# Patient Record
Sex: Male | Born: 1980 | Race: Black or African American | Hispanic: No | Marital: Single | Smoking: Current every day smoker
Health system: Southern US, Community
[De-identification: ages and names within clinical notes are randomized; demographics above are authoritative.]

---

## 2021-03-08 ENCOUNTER — Emergency Department (HOSPITAL_COMMUNITY)
Admission: EM | Admit: 2021-03-08 | Discharge: 2021-03-08 | Disposition: A | Discharge status: Home / Self Care | Payer: 59 | Attending: Emergency Medicine | Admitting: Emergency Medicine

## 2021-03-08 ENCOUNTER — Encounter (HOSPITAL_COMMUNITY): Payer: Self-pay | Admitting: Emergency Medicine

## 2021-03-08 DIAGNOSIS — S3992XA Unspecified injury of lower back, initial encounter: Secondary | ICD-10-CM | POA: Diagnosis present

## 2021-03-08 DIAGNOSIS — F17210 Nicotine dependence, cigarettes, uncomplicated: Secondary | ICD-10-CM | POA: Diagnosis not present

## 2021-03-08 DIAGNOSIS — S39012A Strain of muscle, fascia and tendon of lower back, initial encounter: Secondary | ICD-10-CM | POA: Insufficient documentation

## 2021-03-08 DIAGNOSIS — Y990 Civilian activity done for income or pay: Secondary | ICD-10-CM | POA: Insufficient documentation

## 2021-03-08 DIAGNOSIS — X500XXA Overexertion from strenuous movement or load, initial encounter: Secondary | ICD-10-CM | POA: Diagnosis not present

## 2021-03-08 MED ORDER — ACETAMINOPHEN 325 MG PO TABS
650.0000 mg | ORAL_TABLET | Freq: Once | ORAL | Status: AC
  Administered 2021-03-08: 650 mg via ORAL
  Filled 2021-03-08: qty 2

## 2021-03-08 MED ORDER — METHOCARBAMOL 500 MG PO TABS
500.0000 mg | ORAL_TABLET | Freq: Once | ORAL | Status: AC
  Administered 2021-03-08: 500 mg via ORAL
  Filled 2021-03-08: qty 1

## 2021-03-08 MED ORDER — METHOCARBAMOL 500 MG PO TABS: 500.0000 mg | ORAL_TABLET | Freq: Two times a day (BID) | ORAL | 0 refills | Status: AC

## 2021-03-08 MED ORDER — KETOROLAC TROMETHAMINE 30 MG/ML IJ SOLN
30.0000 mg | Freq: Once | INTRAMUSCULAR | Status: AC
  Administered 2021-03-08: 30 mg via INTRAMUSCULAR
  Filled 2021-03-08: qty 1

## 2021-03-08 MED ORDER — NAPROXEN 500 MG PO TABS: 500.0000 mg | ORAL_TABLET | Freq: Two times a day (BID) | ORAL | 0 refills | Status: AC

## 2021-03-08 NOTE — ED Triage Notes (Signed)
C/o left lower back pain that radiates down L leg since heavy lifting at work on Friday. 

## 2021-03-08 NOTE — ED Provider Notes (Signed)
[facility] EMERGENCY DEPARTMENT Provider Note   CSN: [id number] Arrival date & time: 03/08/21  1133     History No chief complaint on file.   Matthew Jimenez is a 40 y.o. male presenting to the ED with a chief complaint of back pain.  Was at work doing heavy lifting on 03/04/2021.  He has been having left lower back pain with sharp shooting pain down his left leg.  He is concerned that it could be due to sciatica.  Similar symptoms happened several months ago and improved with medication.  He denies any injuries or falls.  He has tried muscle rubs, icy hot and a back brace with only minimal improvement in his symptoms.  Denies any loss of bowel or bladder function, numbness, weakness, dysuria, fever, history of IV drug use, history of back surgery, shortness of breath, abdominal pain, vomiting.  He remains ambulatory.  HPI     History reviewed. No pertinent past medical history.  There are no problems to display for this patient.   History reviewed. No pertinent surgical history.     No family history on file.  Social History   Tobacco Use   Smoking status: Every Day    Pack years: 0.00    Types: Cigarettes   Smokeless tobacco: Never  Substance Use Topics   Alcohol use: Not Currently   Drug use: Not Currently    Home Medications Prior to Admission medications   Medication Sig Start Date End Date Taking? Authorizing Provider  methocarbamol (ROBAXIN) 500 MG tablet Take 1 tablet (500 mg total) by mouth 2 (two) times daily. 03/08/21  Yes Jeromy Borcherding, PA-C  naproxen (NAPROSYN) 500 MG tablet Take 1 tablet (500 mg total) by mouth 2 (two) times daily. 03/08/21  Yes Jerrid Forgette, PA-C    Allergies    Patient has no allergy information on record.  Review of Systems   Review of Systems  Constitutional:  Negative for chills and fever.  Respiratory:  Negative for shortness of breath.   Gastrointestinal:  Negative for vomiting.  Genitourinary:  Negative for  dysuria.  Musculoskeletal:  Positive for myalgias.  Neurological:  Negative for weakness and numbness.   Physical Exam Updated Vital Signs BP 129/77   Pulse 66   Temp 98.1 F (36.7 C)   Resp 20   SpO2 96%   Physical Exam Vitals and nursing note reviewed.  Constitutional:      General: He is not in acute distress.    Appearance: He is well-developed. He is not diaphoretic.  HENT:     Head: Normocephalic and atraumatic.  Eyes:     General: No scleral icterus.    Conjunctiva/sclera: Conjunctivae normal.  Cardiovascular:     Rate and Rhythm: Normal rate and regular rhythm.  Pulmonary:     Effort: Pulmonary effort is normal. No respiratory distress.     Breath sounds: Normal breath sounds.  Musculoskeletal:        General: Tenderness present.     Cervical back: Normal range of motion.     Lumbar back: Tenderness present.       Back:     Comments: No midline spinal tenderness present in lumbar, thoracic or cervical spine. No step-off palpated. No visible bruising, edema or temperature change noted. No objective signs of numbness present. No saddle anesthesia. 2+ DP pulses bilaterally. Sensation intact to light touch. Strength 5/5 in bilateral lower extremities.  Skin:    Findings: No rash.  Neurological:  Mental Status: He is alert.    ED Results / Procedures / Treatments   Labs (all labs ordered are listed, but only abnormal results are displayed) Labs Reviewed - No data to display  EKG None  Radiology No results found.  Procedures Procedures   Medications Ordered in ED Medications  ketorolac (TORADOL) 30 MG/ML injection 30 mg (30 mg Intramuscular Given 03/08/21 1217)  methocarbamol (ROBAXIN) tablet 500 mg (500 mg Oral Given 03/08/21 1217)  acetaminophen (TYLENOL) tablet 650 mg (650 mg Oral Given 03/08/21 1217)    ED Course  I have reviewed the triage vital signs and the nursing notes.  Pertinent labs & imaging results that were available during my care of  the patient were reviewed by me and considered in my medical decision making (see chart for details).    MDM Rules/Calculators/A&P                          Patient denies any concerning symptoms suggestive of cauda equina requiring urgent imaging at this time such as loss of sensation in the lower extremities, lower extremity weakness, loss of bowel or bladder control, saddle anesthesia, urinary retention, fever/chills, IVDU. Exam demonstrated no  weakness on exam today. No preceding injury or trauma to suggest acute fracture. Doubt pelvic or urinary pathology for patient's acute back pain, as patient denies urinary symptoms, has no CVA tenderness, history/pain not consistent with nephrolithiasis. Doubt AAA as cause of patient's back pain as patient lacks major risk factors, had no abdominal TTP, and has symmetric and intact distal pulses.  I suspect that his symptoms are musculoskeletal in nature and could be related to sciatica.  Symptoms improved with medications given here including anti-inflammatories and muscle relaxer.  We will continue with these medications at home.  Patient given strict return precautions for any symptoms indicating worsening neurologic function in the lower extremities.   Patient is hemodynamically stable, in NAD, and able to ambulate in the ED. Evaluation does not show pathology that would require ongoing emergent intervention or inpatient treatment. I explained the diagnosis to the patient. Pain has been managed and has no complaints prior to discharge. Patient is comfortable with above plan and is stable for discharge at this time. All questions were answered prior to disposition. Strict return precautions for returning to the ED were discussed. Encouraged follow up with PCP.   An After Visit Summary was printed and given to the patient.   Portions of this note were generated with Scientist, clinical (histocompatibility and immunogenetics). Dictation errors may occur despite best attempts at  proofreading.  Final Clinical Impression(s) / ED Diagnoses Final diagnoses:  Strain of lumbar region, initial encounter    Rx / DC Orders ED Discharge Orders          Ordered    naproxen (NAPROSYN) 500 MG tablet  2 times daily        03/08/21 1302    methocarbamol (ROBAXIN) 500 MG tablet  2 times daily        03/08/21 1302             Dietrich Pates, PA-C 03/08/21 1306    Gwyneth Sprout, MD 03/10/21 1225

## 2021-03-08 NOTE — Discharge Instructions (Signed)
Take the medications as prescribed as needed to help with your symptoms. Heat, stretch and massaging the area will be helpful to. Return to the ER if you start to experience worsening pain, injuries or falls, shortness of breath, chest pain, losing control of her bowels or bladder.

## 2022-01-11 ENCOUNTER — Ambulatory Visit (HOSPITAL_COMMUNITY)
Admission: EM | Admit: 2022-01-11 | Discharge: 2022-01-11 | Disposition: A | Discharge status: Home / Self Care | Payer: BC Managed Care – PPO | Attending: Emergency Medicine | Admitting: Emergency Medicine

## 2022-01-11 ENCOUNTER — Ambulatory Visit (INDEPENDENT_AMBULATORY_CARE_PROVIDER_SITE_OTHER): Payer: BC Managed Care – PPO

## 2022-01-11 ENCOUNTER — Encounter (HOSPITAL_COMMUNITY): Payer: Self-pay

## 2022-01-11 DIAGNOSIS — M79661 Pain in right lower leg: Secondary | ICD-10-CM | POA: Diagnosis not present

## 2022-01-11 DIAGNOSIS — M79604 Pain in right leg: Secondary | ICD-10-CM

## 2022-01-11 DIAGNOSIS — M7989 Other specified soft tissue disorders: Secondary | ICD-10-CM | POA: Diagnosis not present

## 2022-01-11 NOTE — ED Triage Notes (Signed)
Pt's states he was moving a refrigerator and it rotated and hit him him on his right leg. Pt states swelling and difficulty moving his right leg.  ?

## 2022-01-11 NOTE — ED Provider Notes (Signed)
?[facility] ? ? ? ?CSN: [id number] ?Arrival date & time: 01/11/22  1436 ? ? ?  ? ?History   ?Chief Complaint ?Chief Complaint  ?Patient presents with  ? Leg Injury  ? ? ?HPI ?Matthew Jimenez is a 41 y.o. male.  ? ?Patient presents with right lower extremity pain and swelling for 1 day.  Endorses that he was carrying a [employer] refrigerator when it hit his leg.  Painful with movement and bearing weight.  Bruising present.  Has attempted to ice and use NSAIDs which have been ineffective.  Denies numbness or tingling. ? ?History reviewed. No pertinent past medical history. ? ?There are no problems to display for this patient. ? ? ?History reviewed. No pertinent surgical history. ? ? ? ? ?Home Medications   ? ?Prior to Admission medications   ?Medication Sig Start Date End Date Taking? Authorizing Provider  ?methocarbamol (ROBAXIN) 500 MG tablet Take 1 tablet (500 mg total) by mouth 2 (two) times daily. 03/08/21   Khatri, Hina, PA-C  ?naproxen (NAPROSYN) 500 MG tablet Take 1 tablet (500 mg total) by mouth 2 (two) times daily. 03/08/21   Dietrich Pates, PA-C  ? ? ?Family History ?History reviewed. No pertinent family history. ? ?Social History ?Social History  ? ?Tobacco Use  ? Smoking status: Every Day  ?  Types: Cigarettes  ? Smokeless tobacco: Never  ?Substance Use Topics  ? Alcohol use: Not Currently  ? Drug use: Not Currently  ? ? ? ?Allergies   ?Patient has no allergy information on record. ? ? ?Review of Systems ?Review of Systems ?Defer to HPI  ? ? ?Physical Exam ?Triage Vital Signs ?ED Triage Vitals  ?Enc Vitals Group  ?   BP 01/11/22 1521 (!) 165/100  ?   Pulse Rate 01/11/22 1521 89  ?   Resp 01/11/22 1521 18  ?   Temp 01/11/22 1521 98.1 ?F (36.7 ?C)  ?   Temp Source 01/11/22 1521 Oral  ?   SpO2 01/11/22 1521 97 %  ?   Weight --   ?   Height --   ?   Head Circumference --   ?   Peak Flow --   ?   Pain Score 01/11/22 1519 8  ?   Pain Loc --   ?   Pain Edu? --   ?   Excl. in GC? --   ? ?No data  found. ? ?Updated Vital Signs ?BP (!) 165/100 (BP Location: Right Arm)   Pulse 89   Temp 98.1 ?F (36.7 ?C) (Oral)   Resp 18   SpO2 97%  ? ?Visual Acuity ?Right Eye Distance:   ?Left Eye Distance:   ?Bilateral Distance:   ? ?Right Eye Near:   ?Left Eye Near:    ?Bilateral Near:    ? ?Physical Exam ?Constitutional:   ?   Appearance: Normal appearance.  ?HENT:  ?   Head: Normocephalic.  ?Eyes:  ?   Extraocular Movements: Extraocular movements intact.  ?Pulmonary:  ?   Effort: Pulmonary effort is normal.  ?Musculoskeletal:  ?   Comments: Ecchymosis and moderate swelling present to the lateral aspect of the right knee and upper and lower extremity, tenderness along the medial aspect of the lower extremity, 2+ popliteal pulse, able to bear weight, range of motion intact  ?Neurological:  ?   Mental Status: He is alert and oriented to person, place, and time. Mental status is at baseline.  ?Psychiatric:     ?  Mood and Affect: Mood normal.     ?   Behavior: Behavior normal.  ? ? ? ?UC Treatments / Results  ?Labs ?(all labs ordered are listed, but only abnormal results are displayed) ?Labs Reviewed - No data to display ? ?EKG ? ? ?Radiology ?No results found. ? ?Procedures ?Procedures (including critical care time) ? ?Medications Ordered in UC ?Medications - No data to display ? ?Initial Impression / Assessment and Plan / UC Course  ?I have reviewed the triage vital signs and the nursing notes. ? ?Pertinent labs & imaging results that were available during my care of the patient were reviewed by me and considered in my medical decision making (see chart for details). ? ?Pain and swelling of the right lower leg ? ?Tib-fib x-ray negative, discussed findings with patient, etiology of symptoms is most likely related to injury to the soft tissue as bruising is present, recommended continued treatment with ice, NSAIDs and compression, recommended elevation in addition, work note given, may follow-up with urgent care as needed  if symptoms persist ?Final Clinical Impressions(s) / UC Diagnoses  ? ?Final diagnoses:  ?None  ? ?Discharge Instructions   ?None ?  ? ?ED Prescriptions   ?None ?  ? ?PDMP not reviewed this encounter. ?  ?Valinda Hoar, NP ?01/11/22 1618 ? ?

## 2022-01-11 NOTE — Discharge Instructions (Addendum)
Your x-ray today did not show injury to the bone of right lower leg. Your pain is most likely being caused by irritation to the soft tissues, this should improve as time progresses.  ? ?You may continue to take anti-inflammatory medicine as well as Tylenol for management of your discomfort ? ? ?You may apply heat or ice, whichever makes you feel better, to affected area in 15 minute intervals ? ?You may continue activity as tolerated, there is no injury therefore, it is important that you continue to move around so you do not loose strength to the area ? ?you may wrap your leg  with ace wrap for additional support while completing activities, once wrapped if you begin to experience numbness or tingling it is too tight, remove and redo, you should be able to easily fit one finger under wrap  ? ?If symptoms persist past 2 weeks, you may follow up at urgent care or with orthopedic specialist for evaluation, an orthopedic doctor specializes in the bone, they may provide  management such as but not limited to imaging, long term medications and physical therapy  ?

## 2022-10-18 ENCOUNTER — Emergency Department (HOSPITAL_COMMUNITY)
Admission: EM | Admit: 2022-10-18 | Discharge: 2022-10-18 | Disposition: A | Discharge status: Home / Self Care | Payer: BC Managed Care – PPO | Attending: Emergency Medicine | Admitting: Emergency Medicine

## 2022-10-18 ENCOUNTER — Encounter (HOSPITAL_COMMUNITY): Payer: Self-pay

## 2022-10-18 ENCOUNTER — Emergency Department (HOSPITAL_COMMUNITY): Payer: BC Managed Care – PPO

## 2022-10-18 DIAGNOSIS — S0083XA Contusion of other part of head, initial encounter: Secondary | ICD-10-CM | POA: Diagnosis not present

## 2022-10-18 DIAGNOSIS — S4992XA Unspecified injury of left shoulder and upper arm, initial encounter: Secondary | ICD-10-CM | POA: Insufficient documentation

## 2022-10-18 DIAGNOSIS — R030 Elevated blood-pressure reading, without diagnosis of hypertension: Secondary | ICD-10-CM

## 2022-10-18 DIAGNOSIS — M25512 Pain in left shoulder: Secondary | ICD-10-CM | POA: Diagnosis present

## 2022-10-18 MED ORDER — HYDROCODONE-ACETAMINOPHEN 5-325 MG PO TABS
1.0000 | ORAL_TABLET | Freq: Once | ORAL | Status: AC
  Administered 2022-10-18: 1 via ORAL
  Filled 2022-10-18: qty 1

## 2022-10-18 MED ORDER — OXYCODONE-ACETAMINOPHEN 5-325 MG PO TABS
1.0000 | ORAL_TABLET | Freq: Once | ORAL | Status: AC
  Administered 2022-10-18: 1 via ORAL
  Filled 2022-10-18: qty 1

## 2022-10-18 MED ORDER — IBUPROFEN 400 MG PO TABS
400.0000 mg | ORAL_TABLET | Freq: Once | ORAL | Status: AC
  Administered 2022-10-18: 400 mg via ORAL
  Filled 2022-10-18: qty 1
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
## 2022-10-18 NOTE — Discharge Instructions (Addendum)
Wear the sling as needed.  Apply ice for 30 minutes at a time, 4 times a day.  You may take ibuprofen or naproxen as needed for pain.  To get additional pain relief, add acetaminophen.  When you combine acetaminophen with either ibuprofen or naproxen, you get better pain relief and you get from taking either medication by itself.  Your blood pressure was a little high today.  That is likely from pain and stress of coming to the hospital.  However, it could also mean that you actually have high blood pressure.  Please have your blood pressure checked several times over the next 1-2 weeks.  If it continues to be high, you may need to be on medication to control it.  Inadequately controlled high blood pressure can lead to heart attacks, strokes, kidney failure.
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
## 2023-10-07 IMAGING — DX DG TIBIA/FIBULA 2V*R*
4 series · 4 of 4 positions shown · non-contrast
Comparison: None Available.

CLINICAL DATA: Injury, swelling and difficulty moving right leg,
initial encounter.

EXAM:
RIGHT TIBIA AND FIBULA - 2 VIEW

[tibia ap (1 of 2)]
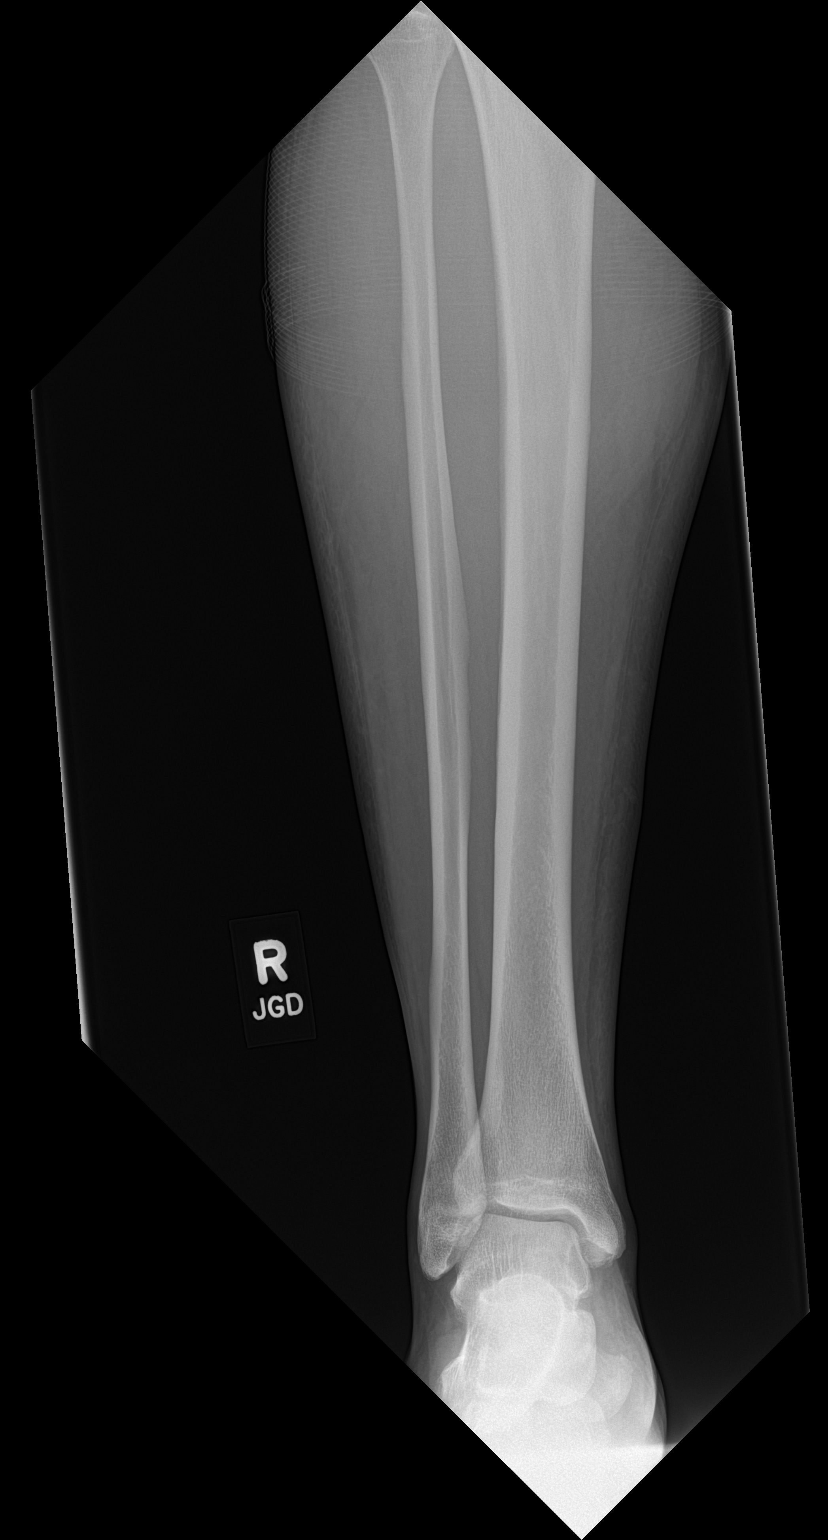

[tibia ap (2 of 2)]
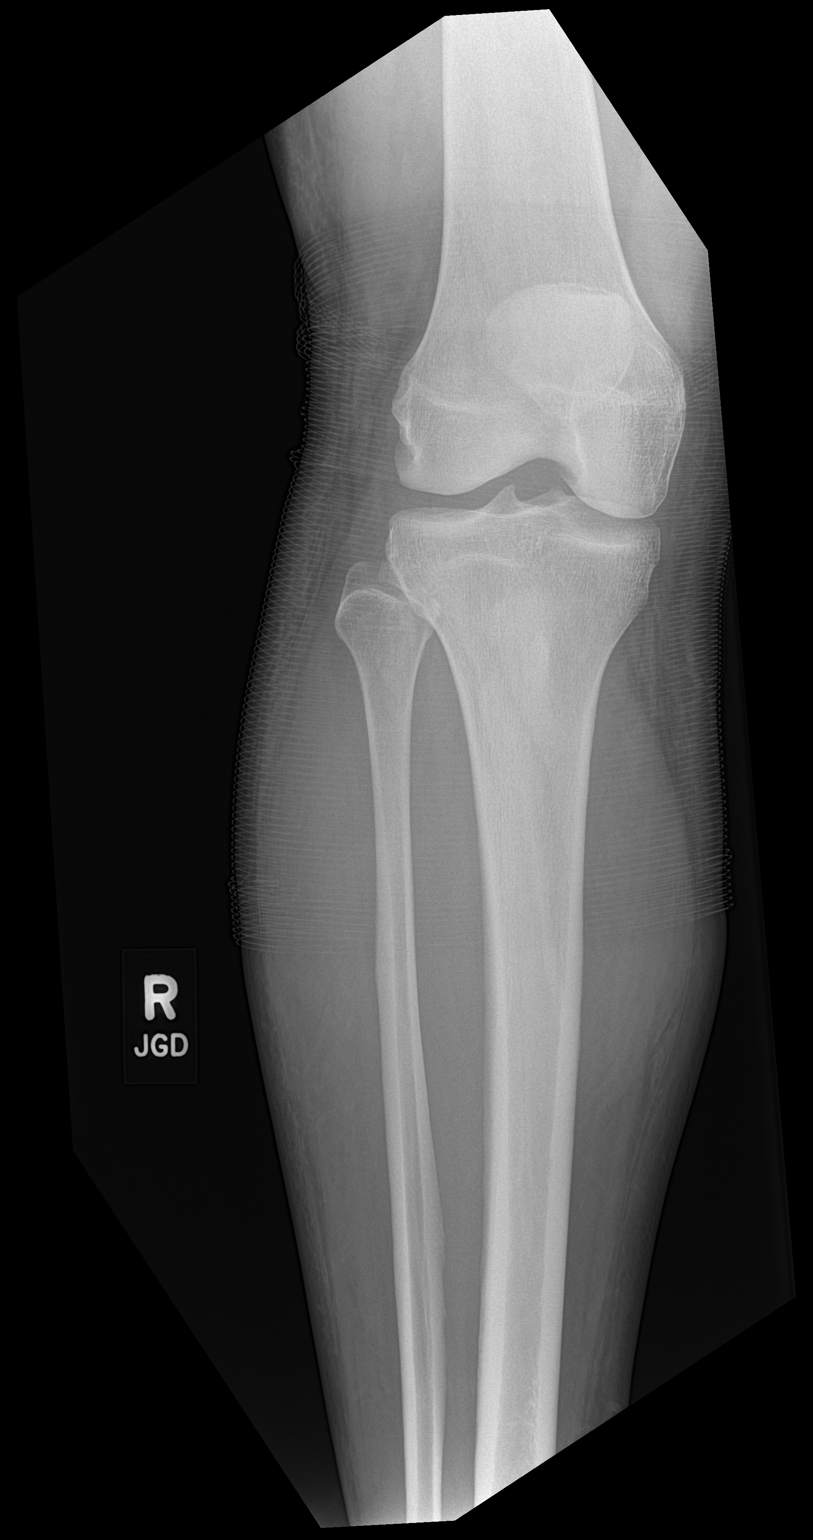

[tibia lat (1 of 2)]
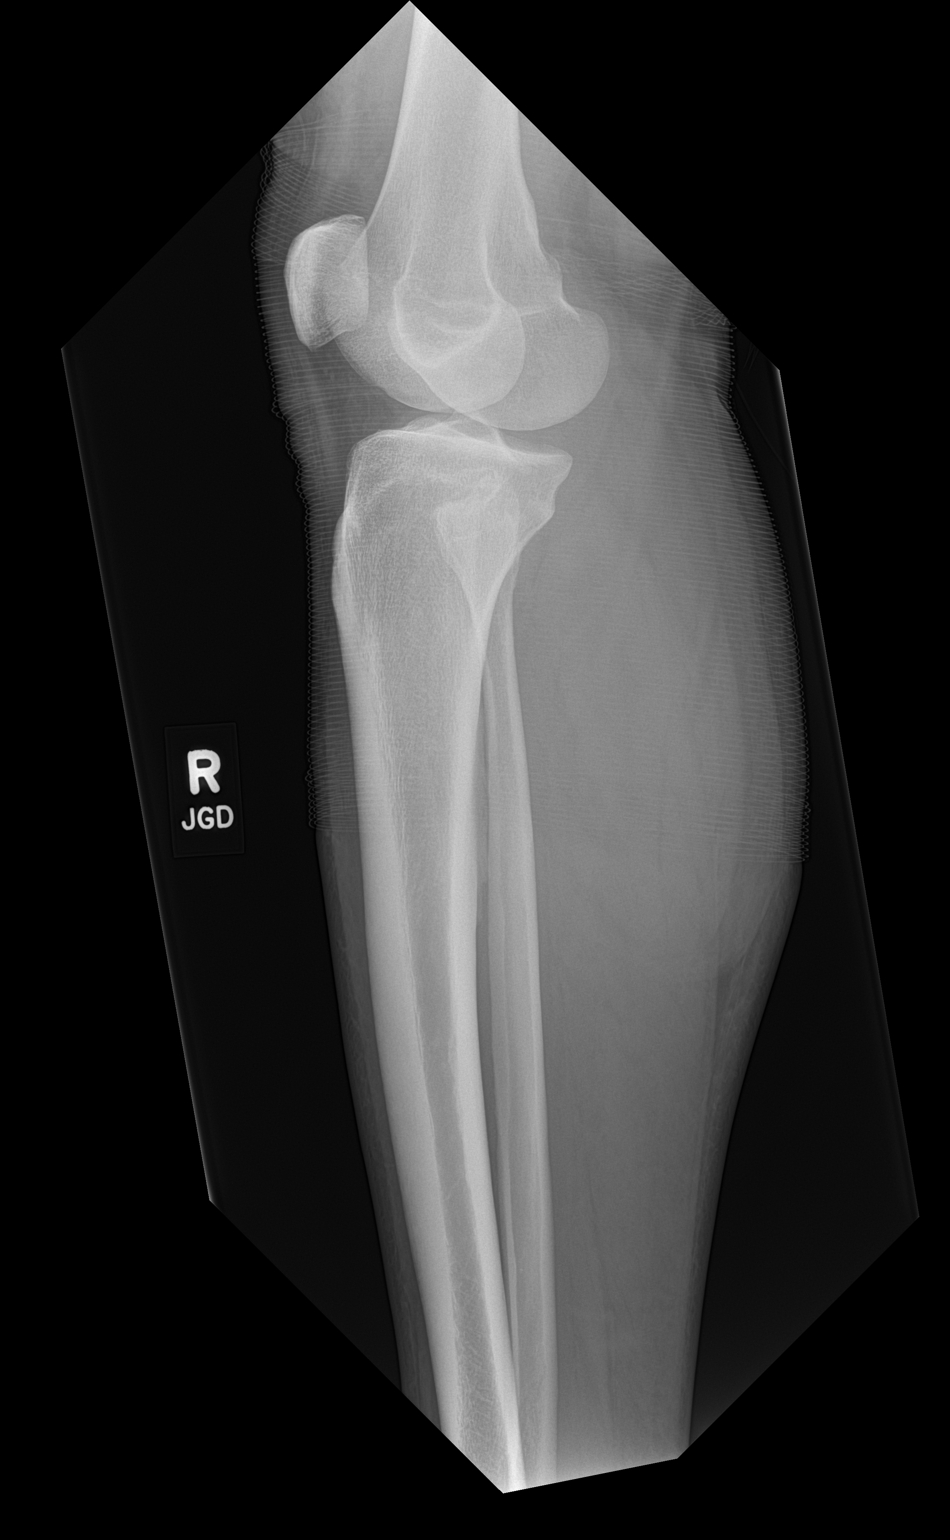

[tibia lat (2 of 2)]
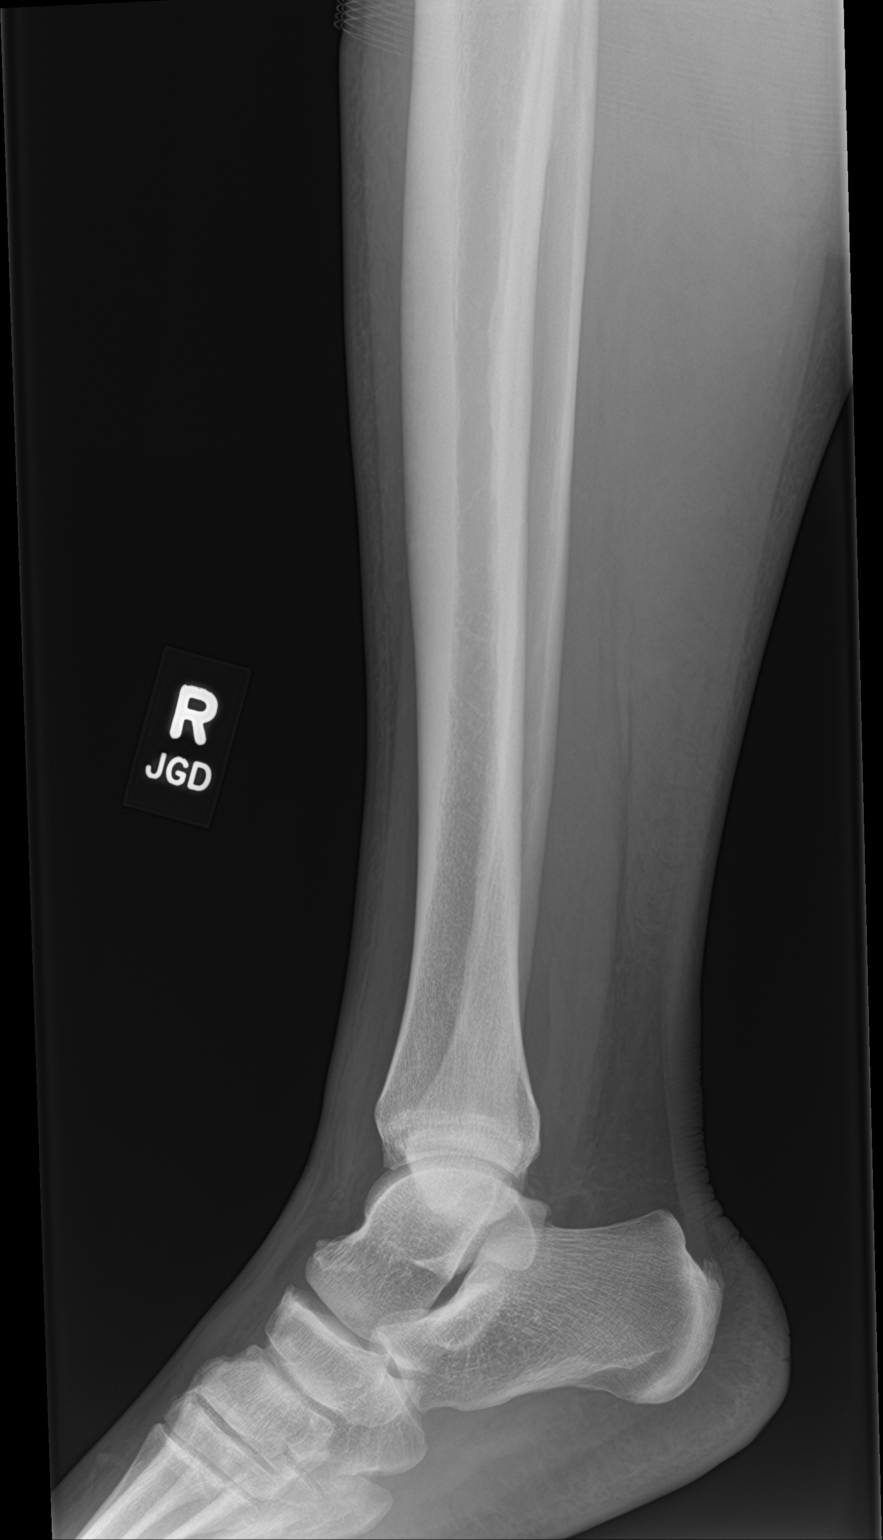

[4 of 4 positions shown; findings below may reference images not displayed]

FINDINGS: No acute osseous abnormality.
IMPRESSION: Negative.
